# Patient Record
Sex: Male | Born: 1950 | Race: White | Hispanic: No | Marital: Married | Smoking: Never smoker
Health system: Southern US, Community
[De-identification: ages and names within clinical notes are randomized; demographics above are authoritative.]

## PROBLEM LIST (undated history)

## (undated) DIAGNOSIS — N189 Chronic kidney disease, unspecified: Secondary | ICD-10-CM

## (undated) DIAGNOSIS — I10 Essential (primary) hypertension: Secondary | ICD-10-CM

## (undated) DIAGNOSIS — I639 Cerebral infarction, unspecified: Secondary | ICD-10-CM

## (undated) DIAGNOSIS — E119 Type 2 diabetes mellitus without complications: Secondary | ICD-10-CM

## (undated) DIAGNOSIS — Z8673 Personal history of transient ischemic attack (TIA), and cerebral infarction without residual deficits: Secondary | ICD-10-CM

## (undated) HISTORY — PX: OTHER SURGICAL HISTORY: SHX169

---

## 2011-09-15 ENCOUNTER — Ambulatory Visit (INDEPENDENT_AMBULATORY_CARE_PROVIDER_SITE_OTHER): Payer: PRIVATE HEALTH INSURANCE | Admitting: Ophthalmology

## 2011-09-15 DIAGNOSIS — E11319 Type 2 diabetes mellitus with unspecified diabetic retinopathy without macular edema: Secondary | ICD-10-CM

## 2011-09-15 DIAGNOSIS — H43819 Vitreous degeneration, unspecified eye: Secondary | ICD-10-CM

## 2011-09-15 DIAGNOSIS — E1139 Type 2 diabetes mellitus with other diabetic ophthalmic complication: Secondary | ICD-10-CM

## 2011-09-24 ENCOUNTER — Encounter (INDEPENDENT_AMBULATORY_CARE_PROVIDER_SITE_OTHER): Payer: PRIVATE HEALTH INSURANCE | Admitting: Ophthalmology

## 2011-09-24 DIAGNOSIS — H3581 Retinal edema: Secondary | ICD-10-CM

## 2011-10-22 ENCOUNTER — Encounter (INDEPENDENT_AMBULATORY_CARE_PROVIDER_SITE_OTHER): Payer: PRIVATE HEALTH INSURANCE | Admitting: Ophthalmology

## 2011-10-22 DIAGNOSIS — E11359 Type 2 diabetes mellitus with proliferative diabetic retinopathy without macular edema: Secondary | ICD-10-CM

## 2012-02-21 ENCOUNTER — Ambulatory Visit (INDEPENDENT_AMBULATORY_CARE_PROVIDER_SITE_OTHER): Payer: PRIVATE HEALTH INSURANCE | Admitting: Ophthalmology

## 2012-08-18 DIAGNOSIS — I639 Cerebral infarction, unspecified: Secondary | ICD-10-CM

## 2012-08-18 HISTORY — DX: Cerebral infarction, unspecified: I63.9

## 2013-04-04 ENCOUNTER — Encounter (INDEPENDENT_AMBULATORY_CARE_PROVIDER_SITE_OTHER): Payer: BC Managed Care – PPO | Admitting: Ophthalmology

## 2013-04-04 DIAGNOSIS — E1165 Type 2 diabetes mellitus with hyperglycemia: Secondary | ICD-10-CM

## 2013-04-04 DIAGNOSIS — I10 Essential (primary) hypertension: Secondary | ICD-10-CM

## 2013-04-04 DIAGNOSIS — E11359 Type 2 diabetes mellitus with proliferative diabetic retinopathy without macular edema: Secondary | ICD-10-CM

## 2013-04-04 DIAGNOSIS — E11319 Type 2 diabetes mellitus with unspecified diabetic retinopathy without macular edema: Secondary | ICD-10-CM

## 2013-04-04 DIAGNOSIS — H43819 Vitreous degeneration, unspecified eye: Secondary | ICD-10-CM

## 2013-04-04 DIAGNOSIS — H35039 Hypertensive retinopathy, unspecified eye: Secondary | ICD-10-CM

## 2013-04-04 DIAGNOSIS — H4310 Vitreous hemorrhage, unspecified eye: Secondary | ICD-10-CM

## 2013-04-05 ENCOUNTER — Encounter (HOSPITAL_COMMUNITY): Payer: Self-pay | Admitting: Respiratory Therapy

## 2013-04-05 DIAGNOSIS — E113593 Type 2 diabetes mellitus with proliferative diabetic retinopathy without macular edema, bilateral: Secondary | ICD-10-CM | POA: Diagnosis present

## 2013-04-05 DIAGNOSIS — H4310 Vitreous hemorrhage, unspecified eye: Secondary | ICD-10-CM | POA: Diagnosis present

## 2013-04-05 NOTE — H&P (Signed)
Wayne Dodson is an 62 y.o. male.   Chief Complaint:loss of vision right eye HPI: longstanding diabetes with new vitreous hemorrhage right eye  No past medical history on file.  No past surgical history on file.  No family history on file. Social History:  has no tobacco, alcohol, and drug history on file.  Allergies: Allergies not on file  No prescriptions prior to admission    Review of systems otherwise negative  There were no vitals taken for this visit.  Physical exam: Mental status: oriented x3. Eyes: See eye exam associated with this date of surgery in media tab.  Scanned in by scanning center Ears, Nose, Throat: within normal limits Neck: Within Normal limits General: within normal limits Chest: Within normal limits Breast: deferred Heart: Within normal limits Abdomen: Within normal limits GU: deferred Extremities: within normal limits Skin: within normal limits  Assessment/Plan New vitreous hemorrhage, proliferative diabetic retinopathy right eye Plan: To [facility] for  Pars plana vitrectomy , laser therapy and gas injection.right eye  Sherrie George 04/05/2013, 7:49 AM

## 2013-04-06 ENCOUNTER — Encounter (HOSPITAL_COMMUNITY): Payer: Self-pay | Admitting: *Deleted

## 2013-04-06 NOTE — Progress Notes (Signed)
This patient back in October of 2013 had "a mini stroke" , was taken to [facility].  He stayed overnight and thinks he had ultrasound of heart, ekg, dopplers of carotids...he has no deficits, and hasn't had a problem since.Marland KitchenMarland KitchenMarland KitchenWill need those records.........  He was told to come @ 9:00...i told him to come @ 8:45.Marland Kitchen.he will be taking most all his meds per the surgeon, except the Amaryl.............DA

## 2013-04-09 MED ORDER — CEFAZOLIN SODIUM-DEXTROSE 2-3 GM-% IV SOLR
2.0000 g | INTRAVENOUS | Status: AC
  Administered 2013-04-10: 2 g via INTRAVENOUS
  Filled 2013-04-09: qty 50

## 2013-04-09 MED ORDER — GATIFLOXACIN 0.5 % OP SOLN
1.0000 [drp] | OPHTHALMIC | Status: AC | PRN
  Administered 2013-04-10: 1 [drp] via OPHTHALMIC
  Filled 2013-04-09: qty 2.5

## 2013-04-09 MED ORDER — TROPICAMIDE 1 % OP SOLN
1.0000 [drp] | OPHTHALMIC | Status: AC | PRN
  Administered 2013-04-10: 1 [drp] via OPHTHALMIC
  Filled 2013-04-09: qty 3

## 2013-04-09 MED ORDER — CYCLOPENTOLATE HCL 1 % OP SOLN
1.0000 [drp] | OPHTHALMIC | Status: AC | PRN
  Administered 2013-04-10: 1 [drp] via OPHTHALMIC
  Filled 2013-04-09: qty 2

## 2013-04-09 MED ORDER — PHENYLEPHRINE HCL 2.5 % OP SOLN: 1.0000 [drp] | OPHTHALMIC | Status: AC | PRN

## 2013-04-10 ENCOUNTER — Encounter (HOSPITAL_COMMUNITY)
Admission: RE | Disposition: A | Discharge status: Home / Self Care | Payer: Self-pay | Source: Ambulatory Visit | Attending: Ophthalmology

## 2013-04-10 ENCOUNTER — Ambulatory Visit (HOSPITAL_COMMUNITY)
Admission: RE | Admit: 2013-04-10 | Discharge: 2013-04-11 | Disposition: A | Discharge status: Home / Self Care | Payer: BC Managed Care – PPO | Source: Ambulatory Visit | Attending: Ophthalmology | Admitting: Ophthalmology

## 2013-04-10 ENCOUNTER — Ambulatory Visit (HOSPITAL_COMMUNITY): Payer: BC Managed Care – PPO

## 2013-04-10 ENCOUNTER — Encounter (HOSPITAL_COMMUNITY): Payer: Self-pay | Admitting: Certified Registered"

## 2013-04-10 ENCOUNTER — Ambulatory Visit (HOSPITAL_COMMUNITY): Payer: BC Managed Care – PPO | Admitting: Certified Registered"

## 2013-04-10 DIAGNOSIS — E1165 Type 2 diabetes mellitus with hyperglycemia: Secondary | ICD-10-CM

## 2013-04-10 DIAGNOSIS — H5450 Low vision, one eye, unspecified eye: Secondary | ICD-10-CM | POA: Insufficient documentation

## 2013-04-10 DIAGNOSIS — E11359 Type 2 diabetes mellitus with proliferative diabetic retinopathy without macular edema: Secondary | ICD-10-CM

## 2013-04-10 DIAGNOSIS — H4311 Vitreous hemorrhage, right eye: Secondary | ICD-10-CM

## 2013-04-10 DIAGNOSIS — E1139 Type 2 diabetes mellitus with other diabetic ophthalmic complication: Secondary | ICD-10-CM

## 2013-04-10 DIAGNOSIS — E113593 Type 2 diabetes mellitus with proliferative diabetic retinopathy without macular edema, bilateral: Secondary | ICD-10-CM | POA: Diagnosis present

## 2013-04-10 DIAGNOSIS — H4310 Vitreous hemorrhage, unspecified eye: Secondary | ICD-10-CM | POA: Diagnosis present

## 2013-04-10 HISTORY — PX: PARS PLANA VITRECTOMY: SHX2166

## 2013-04-10 HISTORY — DX: Type 2 diabetes mellitus without complications: E11.9

## 2013-04-10 HISTORY — PX: PHOTOCOAGULATION WITH LASER: SHX6027

## 2013-04-10 HISTORY — DX: Cerebral infarction, unspecified: I63.9

## 2013-04-10 HISTORY — DX: Chronic kidney disease, unspecified: N18.9

## 2013-04-10 HISTORY — DX: Personal history of transient ischemic attack (TIA), and cerebral infarction without residual deficits: Z86.73

## 2013-04-10 HISTORY — DX: Essential (primary) hypertension: I10

## 2013-04-10 LAB — CBC
MCH: 28.6 pg (ref 26.0–34.0)
Platelets: 157 10*3/uL (ref 150–400)
RBC: 4.34 MIL/uL (ref 4.22–5.81)
WBC: 7.5 10*3/uL (ref 4.0–10.5)

## 2013-04-10 LAB — BASIC METABOLIC PANEL
Calcium: 8.8 mg/dL (ref 8.4–10.5)
GFR calc non Af Amer: 30 mL/min — ABNORMAL LOW (ref >90)
Sodium: 144 mEq/L (ref 135–145)

## 2013-04-10 LAB — GLUCOSE, CAPILLARY
Glucose-Capillary: 118 mg/dL — ABNORMAL HIGH (ref 70–99)
Glucose-Capillary: 90 mg/dL (ref 70–99)
Glucose-Capillary: 105 mg/dL — ABNORMAL HIGH (ref 70–99)
Glucose-Capillary: 220 mg/dL — ABNORMAL HIGH (ref 70–99)

## 2013-04-10 SURGERY — PARS PLANA VITRECTOMY WITH 25 GAUGE
Anesthesia: General | Site: Eye | Laterality: Right | Wound class: Clean

## 2013-04-10 MED ORDER — BRIMONIDINE TARTRATE 0.2 % OP SOLN
1.0000 [drp] | Freq: Two times a day (BID) | OPHTHALMIC | Status: DC
  Filled 2013-04-10 (×2): qty 5

## 2013-04-10 MED ORDER — GATIFLOXACIN 0.5 % OP SOLN
1.0000 [drp] | Freq: Four times a day (QID) | OPHTHALMIC | Status: DC
  Filled 2013-04-10: qty 2.5

## 2013-04-10 MED ORDER — ATROPINE SULFATE 1 % OP SOLN
OPHTHALMIC | Status: AC
  Filled 2013-04-10: qty 2

## 2013-04-10 MED ORDER — LIDOCAINE HCL 4 % MT SOLN
OROMUCOSAL | Status: DC | PRN
  Administered 2013-04-10: 4 mL via TOPICAL

## 2013-04-10 MED ORDER — SODIUM CHLORIDE 0.45 % IV SOLN
INTRAVENOUS | Status: DC
  Administered 2013-04-10: 20 mL/h via INTRAVENOUS

## 2013-04-10 MED ORDER — PROMETHAZINE HCL 25 MG/ML IJ SOLN: 6.2500 mg | INTRAMUSCULAR | Status: DC | PRN

## 2013-04-10 MED ORDER — SODIUM CHLORIDE 0.9 % IV SOLN
INTRAVENOUS | Status: DC | PRN
  Administered 2013-04-10: 12:00:00 via INTRAVENOUS

## 2013-04-10 MED ORDER — TEMAZEPAM 15 MG PO CAPS: 15.0000 mg | ORAL_CAPSULE | Freq: Every evening | ORAL | Status: DC | PRN

## 2013-04-10 MED ORDER — ONDANSETRON HCL 4 MG/2ML IJ SOLN: 4.0000 mg | Freq: Four times a day (QID) | INTRAMUSCULAR | Status: DC | PRN

## 2013-04-10 MED ORDER — ONDANSETRON HCL 4 MG/2ML IJ SOLN
INTRAMUSCULAR | Status: DC | PRN
  Administered 2013-04-10: 4 mg via INTRAVENOUS

## 2013-04-10 MED ORDER — ACETAMINOPHEN 325 MG PO TABS: 325.0000 mg | ORAL_TABLET | ORAL | Status: DC | PRN

## 2013-04-10 MED ORDER — GABAPENTIN 100 MG PO CAPS
100.0000 mg | ORAL_CAPSULE | Freq: Three times a day (TID) | ORAL | Status: DC
  Administered 2013-04-10 (×2): 100 mg via ORAL
  Filled 2013-04-10 (×5): qty 1

## 2013-04-10 MED ORDER — HYDRALAZINE HCL 50 MG PO TABS
50.0000 mg | ORAL_TABLET | Freq: Two times a day (BID) | ORAL | Status: DC
  Administered 2013-04-10: 50 mg via ORAL
  Filled 2013-04-10 (×3): qty 1

## 2013-04-10 MED ORDER — EPINEPHRINE HCL 1 MG/ML IJ SOLN
INTRAMUSCULAR | Status: AC
  Filled 2013-04-10: qty 1

## 2013-04-10 MED ORDER — HYDROCODONE-ACETAMINOPHEN 5-325 MG PO TABS
1.0000 | ORAL_TABLET | ORAL | Status: DC | PRN
  Administered 2013-04-10 (×2): 2 via ORAL
  Filled 2013-04-10: qty 2

## 2013-04-10 MED ORDER — 0.9 % SODIUM CHLORIDE (POUR BTL) OPTIME
TOPICAL | Status: DC | PRN
  Administered 2013-04-10: 1000 mL

## 2013-04-10 MED ORDER — GLYCOPYRROLATE 0.2 MG/ML IJ SOLN
INTRAMUSCULAR | Status: DC | PRN
  Administered 2013-04-10: 0.4 mg via INTRAVENOUS
  Administered 2013-04-10: 0.2 mg via INTRAVENOUS

## 2013-04-10 MED ORDER — MORPHINE SULFATE 2 MG/ML IJ SOLN
1.0000 mg | INTRAMUSCULAR | Status: DC | PRN
  Administered 2013-04-10: 2 mg via INTRAVENOUS

## 2013-04-10 MED ORDER — OXYCODONE HCL 5 MG PO TABS: 5.0000 mg | ORAL_TABLET | Freq: Once | ORAL | Status: DC | PRN

## 2013-04-10 MED ORDER — DOCUSATE SODIUM 100 MG PO CAPS
100.0000 mg | ORAL_CAPSULE | Freq: Two times a day (BID) | ORAL | Status: DC
  Administered 2013-04-10: 100 mg via ORAL
  Filled 2013-04-10 (×2): qty 1

## 2013-04-10 MED ORDER — GLIMEPIRIDE 4 MG PO TABS
4.0000 mg | ORAL_TABLET | Freq: Every day | ORAL | Status: DC
  Administered 2013-04-11: 4 mg via ORAL
  Filled 2013-04-10 (×2): qty 1

## 2013-04-10 MED ORDER — FUROSEMIDE 40 MG PO TABS
40.0000 mg | ORAL_TABLET | Freq: Every day | ORAL | Status: DC
  Filled 2013-04-10: qty 1

## 2013-04-10 MED ORDER — HYDROCHLOROTHIAZIDE 25 MG PO TABS
25.0000 mg | ORAL_TABLET | Freq: Every day | ORAL | Status: DC
  Filled 2013-04-10: qty 1

## 2013-04-10 MED ORDER — HEMOSTATIC AGENTS (NO CHARGE) OPTIME
TOPICAL | Status: DC | PRN
  Administered 2013-04-10: 1 via TOPICAL

## 2013-04-10 MED ORDER — PHENYLEPHRINE HCL 2.5 % OP SOLN
OPHTHALMIC | Status: AC
  Administered 2013-04-10: 1 [drp] via OPHTHALMIC
  Filled 2013-04-10: qty 2

## 2013-04-10 MED ORDER — PHENYLEPHRINE HCL 10 MG/ML IJ SOLN
INTRAMUSCULAR | Status: DC | PRN
  Administered 2013-04-10: 40 ug via INTRAVENOUS

## 2013-04-10 MED ORDER — INSULIN ASPART 100 UNIT/ML ~~LOC~~ SOLN
0.0000 [IU] | SUBCUTANEOUS | Status: DC
Start: 2013-04-10 — End: 2013-04-11
  Administered 2013-04-10: 3 [IU] via SUBCUTANEOUS
  Administered 2013-04-10: 5 [IU] via SUBCUTANEOUS
  Administered 2013-04-11: 3 [IU] via SUBCUTANEOUS
  Administered 2013-04-11: 2 [IU] via SUBCUTANEOUS

## 2013-04-10 MED ORDER — WHITE PETROLATUM GEL
Status: AC
  Administered 2013-04-10: 0.2
  Filled 2013-04-10: qty 5

## 2013-04-10 MED ORDER — MIDAZOLAM HCL 5 MG/5ML IJ SOLN
INTRAMUSCULAR | Status: DC | PRN
  Administered 2013-04-10: 2 mg via INTRAVENOUS

## 2013-04-10 MED ORDER — ROCURONIUM BROMIDE 100 MG/10ML IV SOLN
INTRAVENOUS | Status: DC | PRN
  Administered 2013-04-10: 50 mg via INTRAVENOUS

## 2013-04-10 MED ORDER — ARTIFICIAL TEARS OP OINT
TOPICAL_OINTMENT | OPHTHALMIC | Status: DC | PRN
  Administered 2013-04-10: 1 via OPHTHALMIC

## 2013-04-10 MED ORDER — BUPIVACAINE HCL (PF) 0.75 % IJ SOLN
INTRAMUSCULAR | Status: DC | PRN
  Administered 2013-04-10: 10 mL

## 2013-04-10 MED ORDER — FENTANYL CITRATE 0.05 MG/ML IJ SOLN
INTRAMUSCULAR | Status: DC | PRN
  Administered 2013-04-10: 100 ug via INTRAVENOUS

## 2013-04-10 MED ORDER — BACITRACIN-POLYMYXIN B 500-10000 UNIT/GM OP OINT
TOPICAL_OINTMENT | OPHTHALMIC | Status: AC
  Filled 2013-04-10: qty 3.5

## 2013-04-10 MED ORDER — BACITRACIN-POLYMYXIN B 500-10000 UNIT/GM OP OINT
1.0000 "application " | TOPICAL_OINTMENT | Freq: Four times a day (QID) | OPHTHALMIC | Status: DC
  Filled 2013-04-10: qty 3.5

## 2013-04-10 MED ORDER — GENTAMICIN SULFATE 40 MG/ML IJ SOLN
INTRAMUSCULAR | Status: AC
  Filled 2013-04-10: qty 2

## 2013-04-10 MED ORDER — BUPIVACAINE HCL (PF) 0.75 % IJ SOLN
INTRAMUSCULAR | Status: AC
  Filled 2013-04-10: qty 10

## 2013-04-10 MED ORDER — EPINEPHRINE HCL 1 MG/ML IJ SOLN
INTRAOCULAR | Status: DC | PRN
  Administered 2013-04-10: 12:00:00

## 2013-04-10 MED ORDER — MORPHINE SULFATE 2 MG/ML IJ SOLN: 1.0000 mg | INTRAMUSCULAR | Status: DC | PRN

## 2013-04-10 MED ORDER — MAGNESIUM HYDROXIDE 400 MG/5ML PO SUSP: 15.0000 mL | Freq: Four times a day (QID) | ORAL | Status: DC | PRN

## 2013-04-10 MED ORDER — SIMVASTATIN 10 MG PO TABS
10.0000 mg | ORAL_TABLET | Freq: Every day | ORAL | Status: DC
  Administered 2013-04-10: 10 mg via ORAL
  Filled 2013-04-10 (×2): qty 1

## 2013-04-10 MED ORDER — TETRACAINE HCL 0.5 % OP SOLN
2.0000 [drp] | Freq: Once | OPHTHALMIC | Status: DC
  Filled 2013-04-10: qty 2

## 2013-04-10 MED ORDER — BACITRACIN-POLYMYXIN B 500-10000 UNIT/GM OP OINT
TOPICAL_OINTMENT | OPHTHALMIC | Status: DC | PRN
  Administered 2013-04-10: 1 via OPHTHALMIC

## 2013-04-10 MED ORDER — SODIUM HYALURONATE 10 MG/ML IO SOLN
INTRAOCULAR | Status: AC
  Filled 2013-04-10: qty 0.85

## 2013-04-10 MED ORDER — SODIUM CHLORIDE 0.9 % IJ SOLN
INTRAMUSCULAR | Status: DC | PRN
  Administered 2013-04-10: 12:00:00

## 2013-04-10 MED ORDER — OXYCODONE HCL 5 MG/5ML PO SOLN: 5.0000 mg | Freq: Once | ORAL | Status: DC | PRN

## 2013-04-10 MED ORDER — NEOSTIGMINE METHYLSULFATE 1 MG/ML IJ SOLN
INTRAMUSCULAR | Status: DC | PRN
  Administered 2013-04-10: 3 mg via INTRAVENOUS

## 2013-04-10 MED ORDER — DEXAMETHASONE SODIUM PHOSPHATE 10 MG/ML IJ SOLN
INTRAMUSCULAR | Status: AC
  Filled 2013-04-10: qty 1

## 2013-04-10 MED ORDER — LATANOPROST 0.005 % OP SOLN
1.0000 [drp] | Freq: Every day | OPHTHALMIC | Status: DC
  Filled 2013-04-10: qty 2.5

## 2013-04-10 MED ORDER — NIFEDIPINE ER OSMOTIC RELEASE 90 MG PO TB24
90.0000 mg | ORAL_TABLET | Freq: Every day | ORAL | Status: DC
  Administered 2013-04-10: 90 mg via ORAL
  Filled 2013-04-10 (×2): qty 1

## 2013-04-10 MED ORDER — DEXAMETHASONE SODIUM PHOSPHATE 10 MG/ML IJ SOLN
INTRAMUSCULAR | Status: DC | PRN
  Administered 2013-04-10: 10 mg

## 2013-04-10 MED ORDER — PROPOFOL 10 MG/ML IV BOLUS
INTRAVENOUS | Status: DC | PRN
  Administered 2013-04-10: 160 mg via INTRAVENOUS

## 2013-04-10 MED ORDER — HYALURONIDASE HUMAN 150 UNIT/ML IJ SOLN
INTRAMUSCULAR | Status: AC
  Filled 2013-04-10: qty 1

## 2013-04-10 MED ORDER — ACETAZOLAMIDE SODIUM 500 MG IJ SOLR
500.0000 mg | Freq: Once | INTRAMUSCULAR | Status: AC
Start: 2013-04-11 — End: 2013-04-11
  Administered 2013-04-11: 500 mg via INTRAVENOUS
  Filled 2013-04-10: qty 500

## 2013-04-10 MED ORDER — NEBIVOLOL HCL 10 MG PO TABS
10.0000 mg | ORAL_TABLET | Freq: Every day | ORAL | Status: DC
  Administered 2013-04-10: 10 mg via ORAL
  Filled 2013-04-10 (×2): qty 1

## 2013-04-10 MED ORDER — MUPIROCIN 2 % EX OINT
TOPICAL_OINTMENT | CUTANEOUS | Status: AC
  Administered 2013-04-10: 1
  Filled 2013-04-10: qty 22

## 2013-04-10 MED ORDER — MORPHINE SULFATE 4 MG/ML IJ SOLN
INTRAMUSCULAR | Status: AC
  Filled 2013-04-10: qty 1

## 2013-04-10 MED ORDER — POLYMYXIN B SULFATE 500000 UNITS IJ SOLR
INTRAMUSCULAR | Status: AC
  Filled 2013-04-10: qty 1

## 2013-04-10 MED ORDER — SODIUM HYALURONATE 10 MG/ML IO SOLN
INTRAOCULAR | Status: DC | PRN
  Administered 2013-04-10: 0.85 mL via INTRAOCULAR

## 2013-04-10 MED ORDER — PREDNISOLONE ACETATE 1 % OP SUSP
1.0000 [drp] | Freq: Four times a day (QID) | OPHTHALMIC | Status: DC
  Filled 2013-04-10: qty 5
  Filled 2013-04-10: qty 1

## 2013-04-10 MED ORDER — LIDOCAINE HCL (CARDIAC) 20 MG/ML IV SOLN
INTRAVENOUS | Status: DC | PRN
  Administered 2013-04-10: 5 mg via INTRAVENOUS

## 2013-04-10 MED ORDER — HYDROCODONE-ACETAMINOPHEN 5-325 MG PO TABS
ORAL_TABLET | ORAL | Status: AC
  Filled 2013-04-10: qty 2

## 2013-04-10 SURGICAL SUPPLY — 48 items
CANNULA VLV SOFT TIP 25GA (OPHTHALMIC) ×2 IMPLANT
CLOTH BEACON ORANGE TIMEOUT ST (SAFETY) ×2 IMPLANT
CONSTELLATION VIT 25 + ×2 IMPLANT
COTTONBALL LRG STERILE PKG (GAUZE/BANDAGES/DRESSINGS) ×6 IMPLANT
DRAPE INCISE 51X51 W/FILM STRL (DRAPES) ×2 IMPLANT
DRAPE OPHTHALMIC 77X100 STRL (CUSTOM PROCEDURE TRAY) ×2 IMPLANT
FORCEPS ECKARDT ILM 25G SERR (OPHTHALMIC RELATED) IMPLANT
GLOVE SS BIOGEL STRL SZ 6.5 (GLOVE) ×1 IMPLANT
GLOVE SS BIOGEL STRL SZ 7 (GLOVE) ×1 IMPLANT
GLOVE SUPERSENSE BIOGEL SZ 6.5 (GLOVE) ×1
GLOVE SUPERSENSE BIOGEL SZ 7 (GLOVE) ×1
GLOVE SURG 8.5 LATEX PF (GLOVE) ×2 IMPLANT
GLOVE SURG SS PI 6.0 STRL IVOR (GLOVE) ×4 IMPLANT
GOWN STRL NON-REIN LRG LVL3 (GOWN DISPOSABLE) ×8 IMPLANT
ILLUMINATOR CHOW PICK 25GA (MISCELLANEOUS) ×2 IMPLANT
KIT BASIN OR (CUSTOM PROCEDURE TRAY) ×2 IMPLANT
LENS BIOM SUPER VIEW SET DISP (OPHTHALMIC RELATED) IMPLANT
MARKER SKIN DUAL TIP RULER LAB (MISCELLANEOUS) IMPLANT
MASK EYE SHIELD (GAUZE/BANDAGES/DRESSINGS) ×2 IMPLANT
MICROPICK 25G (MISCELLANEOUS)
NEEDLE 18GX1X1/2 (RX/OR ONLY) (NEEDLE) ×2 IMPLANT
NEEDLE 25GX 5/8IN NON SAFETY (NEEDLE) ×2 IMPLANT
NEEDLE FILTER BLUNT 18X 1/2SAF (NEEDLE) ×1
NEEDLE FILTER BLUNT 18X1 1/2 (NEEDLE) ×1 IMPLANT
NEEDLE HYPO 30X.5 LL (NEEDLE) ×2 IMPLANT
NS IRRIG 1000ML POUR BTL (IV SOLUTION) ×2 IMPLANT
PACK VITRECTOMY CUSTOM (CUSTOM PROCEDURE TRAY) ×2 IMPLANT
PAD ARMBOARD 7.5X6 YLW CONV (MISCELLANEOUS) ×4 IMPLANT
PAD EYE OVAL STERILE LF (GAUZE/BANDAGES/DRESSINGS) ×2 IMPLANT
PENCIL BIPOLAR 25GA STR DISP (OPHTHALMIC RELATED) IMPLANT
PICK MICROPICK 25G (MISCELLANEOUS) IMPLANT
PROBE DIRECTIONAL LASER (MISCELLANEOUS) ×2 IMPLANT
PROBE LASER ILLUM FLEX CVD 25G (OPHTHALMIC) ×2 IMPLANT
RESERVOIR BACK FLUSH (MISCELLANEOUS) IMPLANT
ROLLS DENTAL (MISCELLANEOUS) ×4 IMPLANT
SCRAPER DIAMOND DUST MEMBRANE (MISCELLANEOUS) IMPLANT
SPONGE SURGIFOAM ABS GEL 12-7 (HEMOSTASIS) ×2 IMPLANT
STOPCOCK 4 WAY LG BORE MALE ST (IV SETS) IMPLANT
SUT CHROMIC 7 0 TG140 8 (SUTURE) IMPLANT
SYR 20CC LL (SYRINGE) ×2 IMPLANT
SYR BULB 3OZ (MISCELLANEOUS) ×2 IMPLANT
SYR TB 1ML LUER SLIP (SYRINGE) ×2 IMPLANT
TAPE SURG TRANSPORE 1 IN (GAUZE/BANDAGES/DRESSINGS) ×1 IMPLANT
TAPE SURGICAL TRANSPORE 1 IN (GAUZE/BANDAGES/DRESSINGS) ×1
TOWEL OR 17X24 6PK STRL BLUE (TOWEL DISPOSABLE) ×6 IMPLANT
TROCAR CANNULA 25GA (CANNULA) IMPLANT
WATER STERILE IRR 1000ML POUR (IV SOLUTION) ×2 IMPLANT
WIPE INSTRUMENT VISIWIPE 73X73 (MISCELLANEOUS) ×2 IMPLANT

## 2013-04-10 NOTE — Progress Notes (Signed)
Report given to mark rn as caregiver 

## 2013-04-10 NOTE — Preoperative (Signed)
Beta Blockers   Reason not to administer Beta Blockers:Nebivolol taken by patient at 2000 hrs on 03/2313

## 2013-04-10 NOTE — Progress Notes (Signed)
Call to [facility]osp, spoke with Earley Abide in Health information dept., request ekg , echocardiogram be faxed to [facility], asap. Faxed cover sheet /w [facility] letterhead.

## 2013-04-10 NOTE — Anesthesia Procedure Notes (Signed)
Procedure Name: Intubation Date/Time: 04/10/2013 12:23 PM Performed by: Charm Barges, Zakira Ressel R Pre-anesthesia Checklist: Patient identified, Emergency Drugs available, Suction available, Patient being monitored and Timeout performed Patient Re-evaluated:Patient Re-evaluated prior to inductionOxygen Delivery Method: Circle system utilized Preoxygenation: Pre-oxygenation with 100% oxygen Intubation Type: IV induction Ventilation: Mask ventilation without difficulty Laryngoscope Size: Mac and 4 Grade View: Grade II Tube type: Oral Tube size: 7.5 mm Number of attempts: 1 Airway Equipment and Method: Stylet Placement Confirmation: ETT inserted through vocal cords under direct vision,  positive ETCO2 and breath sounds checked- equal and bilateral Secured at: 21 cm Tube secured with: Tape Dental Injury: Teeth and Oropharynx as per pre-operative assessment

## 2013-04-10 NOTE — Op Note (Signed)
NAME:  Wayne Dodson, Wayne Dodson NO.:  [id number]  MEDICAL RECORD NO.:  [id number]  LOCATION:  [facility]                         FACILITY:  [facility]  PHYSICIAN:  Beulah Gandy. Ashley Royalty, M.D. DATE OF BIRTH:  October 22, 1950  DATE OF PROCEDURE:  04/10/2013 DATE OF DISCHARGE:                              OPERATIVE REPORT   ADMISSION DIAGNOSES:  Vitreous hemorrhage, proliferative diabetic retinopathy, right eye.  PROCEDURES:  Pars plana vitrectomy, retinal photocoagulation, gas fluid exchange, right eye.  SURGEON:  Beulah Gandy. Ashley Royalty, M.D.  ASSISTANT:  Rosalie Doctor SA.  ANESTHESIA:  General.  DETAILS:  Usual prep and drape, 25-gauge trocars placed at 8, 10 and 2 o'clock, infusion at 8 o'clock.  The lighted pick and the cutter placed at 10 and 2 o'clock respectively.  Pars plana vitrectomy was begun just behind the crystalline lens.  The vitrectomy was carried down to the macular surface where blood was encountered.  This was carefully removed under low suction and rapid cutting.  There was some traction on the disk, these membranes were lifted with the lighted pick and the cutter. These were removed carefully.  The vitrectomy was carried out to the mid periphery where surface proliferation was removed from the retinal surface.  The vitrectomy was carried into the far periphery where blood was vacuumed and removed in large chunks from the far periphery. Scleral depression was used.  Once this was accomplished, the endolaser was positioned in the eye, 862 burns were placed around the retinal periphery.  The power was 440 mW, 1000 microns each and 0.1 seconds each.  A 30% gas fluid exchange was then carried out.  The instruments removed from the eye.  The 25-gauge trocars were removed.  The wounds were tested and found to be secure.  Polymyxin and gentamicin were irrigated into tenon space.  No atropine solution was used.  Marcaine was injected around the globe for postop pain.  Decadron 10 mg  was injected to the lower subconjunctival space.  The closing pressure was 10 with a Barraquer tonometer.  COMPLICATIONS:  None.  DURATION:  1 hour.  The patient was awakened, taken to recovery in a satisfactory condition.    Beulah Gandy. Ashley Royalty, M.D.    JDM/MEDQ  D:  04/10/2013  T:  04/10/2013  Job:  [id number]

## 2013-04-10 NOTE — Transfer of Care (Signed)
Immediate Anesthesia Transfer of Care Note  Patient: Wayne Dodson  Procedure(s) Performed: Procedure(s): PARS PLANA VITRECTOMY WITH 25 GAUGE (Right) ENDOLASER (Right)  Patient Location: PACU  Anesthesia Type:General  Level of Consciousness: awake  Airway & Oxygen Therapy: Patient Spontanous Breathing and Patient connected to nasal cannula oxygen  Post-op Assessment: Report given to PACU RN, Post -op Vital signs reviewed and stable and Patient moving all extremities  Post vital signs: Reviewed and stable  Complications: No apparent anesthesia complications

## 2013-04-10 NOTE — Brief Op Note (Signed)
Brief Operative note   Preoperative diagnosis:  Pre-Op Diagnosis Codes:    * Vitreous hemorrhage, right [379.23] Postoperative diagnosis  Post-Op Diagnosis Codes:    * Vitreous hemorrhage, right [379.23]  Procedures: Pars plana vitrectomy, laser, peel and gas injection right eye  Surgeon:  Sherrie George, MD...  Assistant:  Rosalie Doctor SA    Anesthesia: General  Specimen: none  Estimated blood loss:  1cc  Complications: none  Patient sent to PACU in good condition  Composed by Sherrie George MD  Dictation number: [phone]

## 2013-04-10 NOTE — H&P (Signed)
I examined the patient today and there is no change in the medical status 

## 2013-04-10 NOTE — Anesthesia Preprocedure Evaluation (Addendum)
Anesthesia Evaluation  Patient identified by MRN, date of birth, ID band Patient awake    Reviewed: Allergy & Precautions, H&P , NPO status , Patient's Chart, lab work & pertinent test results  Airway Mallampati: II TM Distance: >3 FB Neck ROM: Full    Dental  (+) Teeth Intact and Partial Upper   Pulmonary neg pulmonary ROS,  breath sounds clear to auscultation        Cardiovascular hypertension, Rhythm:Regular Rate:Normal + Systolic murmurs    Neuro/Psych negative neurological ROS     GI/Hepatic negative GI ROS, Neg liver ROS,   Endo/Other  diabetes  Renal/GU Renal InsufficiencyRenal diseasenegative Renal ROS  negative genitourinary   Musculoskeletal   Abdominal   Peds  Hematology   Anesthesia Other Findings   Reproductive/Obstetrics                        Anesthesia Physical Anesthesia Plan  ASA: III  Anesthesia Plan: General   Post-op Pain Management:    Induction: Intravenous  Airway Management Planned: Oral ETT  Additional Equipment:   Intra-op Plan:   Post-operative Plan: Extubation in OR  Informed Consent: I have reviewed the patients History and Physical, chart, labs and discussed the procedure including the risks, benefits and alternatives for the proposed anesthesia with the patient or authorized representative who has indicated his/her understanding and acceptance.   Dental advisory given  Plan Discussed with: CRNA, Surgeon and Anesthesiologist  Anesthesia Plan Comments:        Anesthesia Quick Evaluation

## 2013-04-11 MED ORDER — GATIFLOXACIN 0.5 % OP SOLN: 1.0000 [drp] | Freq: Four times a day (QID) | OPHTHALMIC | Status: AC

## 2013-04-11 MED ORDER — BACITRACIN-POLYMYXIN B 500-10000 UNIT/GM OP OINT: 1.0000 "application " | TOPICAL_OINTMENT | Freq: Four times a day (QID) | OPHTHALMIC | Status: AC

## 2013-04-11 MED ORDER — PREDNISOLONE ACETATE 1 % OP SUSP: 1.0000 [drp] | Freq: Four times a day (QID) | OPHTHALMIC | Status: AC

## 2013-04-11 NOTE — Discharge Summary (Signed)
Pt d/ced home with wife 

## 2013-04-11 NOTE — Progress Notes (Signed)
04/11/2013, 6:42 AM  Mental Status:  Awake, Alert, Oriented  Anterior segment: Cornea  Clear    Anterior Chamber Clear    Lens:   Cataract  Intra Ocular Pressure 20 mmHg with Tonopen  Vitreous: Clear 30%gas bubble   Retina:  Attached Good laser reaction   Impression: Excellent result Retina attached   Final Diagnosis: Principal Problem:   Proliferative diabetic retinopathy of both eyes Active Problems:   Vitreous hemorrhage   Plan: start post operative eye drops.  Discharge to home.  Give post operative instructions  Wayne Dodson 04/11/2013, 6:42 AM

## 2013-04-11 NOTE — Discharge Summary (Signed)
Discharge summary not needed on [facility] patients per medical records. 

## 2013-04-13 ENCOUNTER — Encounter (HOSPITAL_COMMUNITY): Payer: Self-pay | Admitting: Ophthalmology

## 2013-04-18 ENCOUNTER — Inpatient Hospital Stay (INDEPENDENT_AMBULATORY_CARE_PROVIDER_SITE_OTHER): Payer: BC Managed Care – PPO | Admitting: Ophthalmology

## 2013-04-18 DIAGNOSIS — E1139 Type 2 diabetes mellitus with other diabetic ophthalmic complication: Secondary | ICD-10-CM

## 2013-04-18 DIAGNOSIS — E11359 Type 2 diabetes mellitus with proliferative diabetic retinopathy without macular edema: Secondary | ICD-10-CM

## 2013-04-18 DIAGNOSIS — E1165 Type 2 diabetes mellitus with hyperglycemia: Secondary | ICD-10-CM

## 2013-04-30 NOTE — Anesthesia Postprocedure Evaluation (Signed)
  Anesthesia Post-op Note  Patient: Wayne Dodson  Procedure(s) Performed: Procedure(s): PARS PLANA VITRECTOMY WITH 25 GAUGE (Right) ENDOLASER (Right)  Patient Location: PACU  Anesthesia Type:General  Level of Consciousness: awake  Airway and Oxygen Therapy: Patient Spontanous Breathing  Post-op Pain: mild  Post-op Assessment: Post-op Vital signs reviewed  Post-op Vital Signs: stable  Complications: No apparent anesthesia complications

## 2013-05-10 ENCOUNTER — Encounter (INDEPENDENT_AMBULATORY_CARE_PROVIDER_SITE_OTHER): Payer: BC Managed Care – PPO | Admitting: Ophthalmology

## 2013-05-10 DIAGNOSIS — E1165 Type 2 diabetes mellitus with hyperglycemia: Secondary | ICD-10-CM

## 2013-05-10 DIAGNOSIS — E11359 Type 2 diabetes mellitus with proliferative diabetic retinopathy without macular edema: Secondary | ICD-10-CM

## 2013-05-10 DIAGNOSIS — E1139 Type 2 diabetes mellitus with other diabetic ophthalmic complication: Secondary | ICD-10-CM

## 2013-11-13 ENCOUNTER — Ambulatory Visit (INDEPENDENT_AMBULATORY_CARE_PROVIDER_SITE_OTHER): Payer: BC Managed Care – PPO | Admitting: Ophthalmology

## 2013-11-13 DIAGNOSIS — H35039 Hypertensive retinopathy, unspecified eye: Secondary | ICD-10-CM

## 2013-11-13 DIAGNOSIS — E1139 Type 2 diabetes mellitus with other diabetic ophthalmic complication: Secondary | ICD-10-CM

## 2013-11-13 DIAGNOSIS — I10 Essential (primary) hypertension: Secondary | ICD-10-CM

## 2013-11-13 DIAGNOSIS — H2510 Age-related nuclear cataract, unspecified eye: Secondary | ICD-10-CM

## 2013-11-13 DIAGNOSIS — E11319 Type 2 diabetes mellitus with unspecified diabetic retinopathy without macular edema: Secondary | ICD-10-CM

## 2013-11-13 DIAGNOSIS — E1165 Type 2 diabetes mellitus with hyperglycemia: Secondary | ICD-10-CM

## 2013-11-13 DIAGNOSIS — H43819 Vitreous degeneration, unspecified eye: Secondary | ICD-10-CM

## 2013-11-13 DIAGNOSIS — E11359 Type 2 diabetes mellitus with proliferative diabetic retinopathy without macular edema: Secondary | ICD-10-CM

## 2014-05-22 ENCOUNTER — Ambulatory Visit (INDEPENDENT_AMBULATORY_CARE_PROVIDER_SITE_OTHER): Payer: BC Managed Care – PPO | Admitting: Ophthalmology

## 2014-05-23 ENCOUNTER — Ambulatory Visit (INDEPENDENT_AMBULATORY_CARE_PROVIDER_SITE_OTHER): Payer: BC Managed Care – PPO | Admitting: Ophthalmology

## 2014-05-23 DIAGNOSIS — H2510 Age-related nuclear cataract, unspecified eye: Secondary | ICD-10-CM

## 2014-05-23 DIAGNOSIS — E11319 Type 2 diabetes mellitus with unspecified diabetic retinopathy without macular edema: Secondary | ICD-10-CM

## 2014-05-23 DIAGNOSIS — E11359 Type 2 diabetes mellitus with proliferative diabetic retinopathy without macular edema: Secondary | ICD-10-CM

## 2014-05-23 DIAGNOSIS — E1139 Type 2 diabetes mellitus with other diabetic ophthalmic complication: Secondary | ICD-10-CM

## 2014-05-23 DIAGNOSIS — H43819 Vitreous degeneration, unspecified eye: Secondary | ICD-10-CM

## 2014-05-23 DIAGNOSIS — E1165 Type 2 diabetes mellitus with hyperglycemia: Secondary | ICD-10-CM

## 2014-05-23 DIAGNOSIS — H35039 Hypertensive retinopathy, unspecified eye: Secondary | ICD-10-CM

## 2014-05-23 DIAGNOSIS — I10 Essential (primary) hypertension: Secondary | ICD-10-CM

## 2014-06-29 IMAGING — CR DG CHEST 2V
2 series · 2 of 2 positions shown · non-contrast
Comparison: None.

CLINICAL DATA: Preoperative respiratory evaluation prior to
ophthalmologic surgery.  Current history of diabetes and
hypertension.

CHEST - 2 VIEW

[w chest pa]
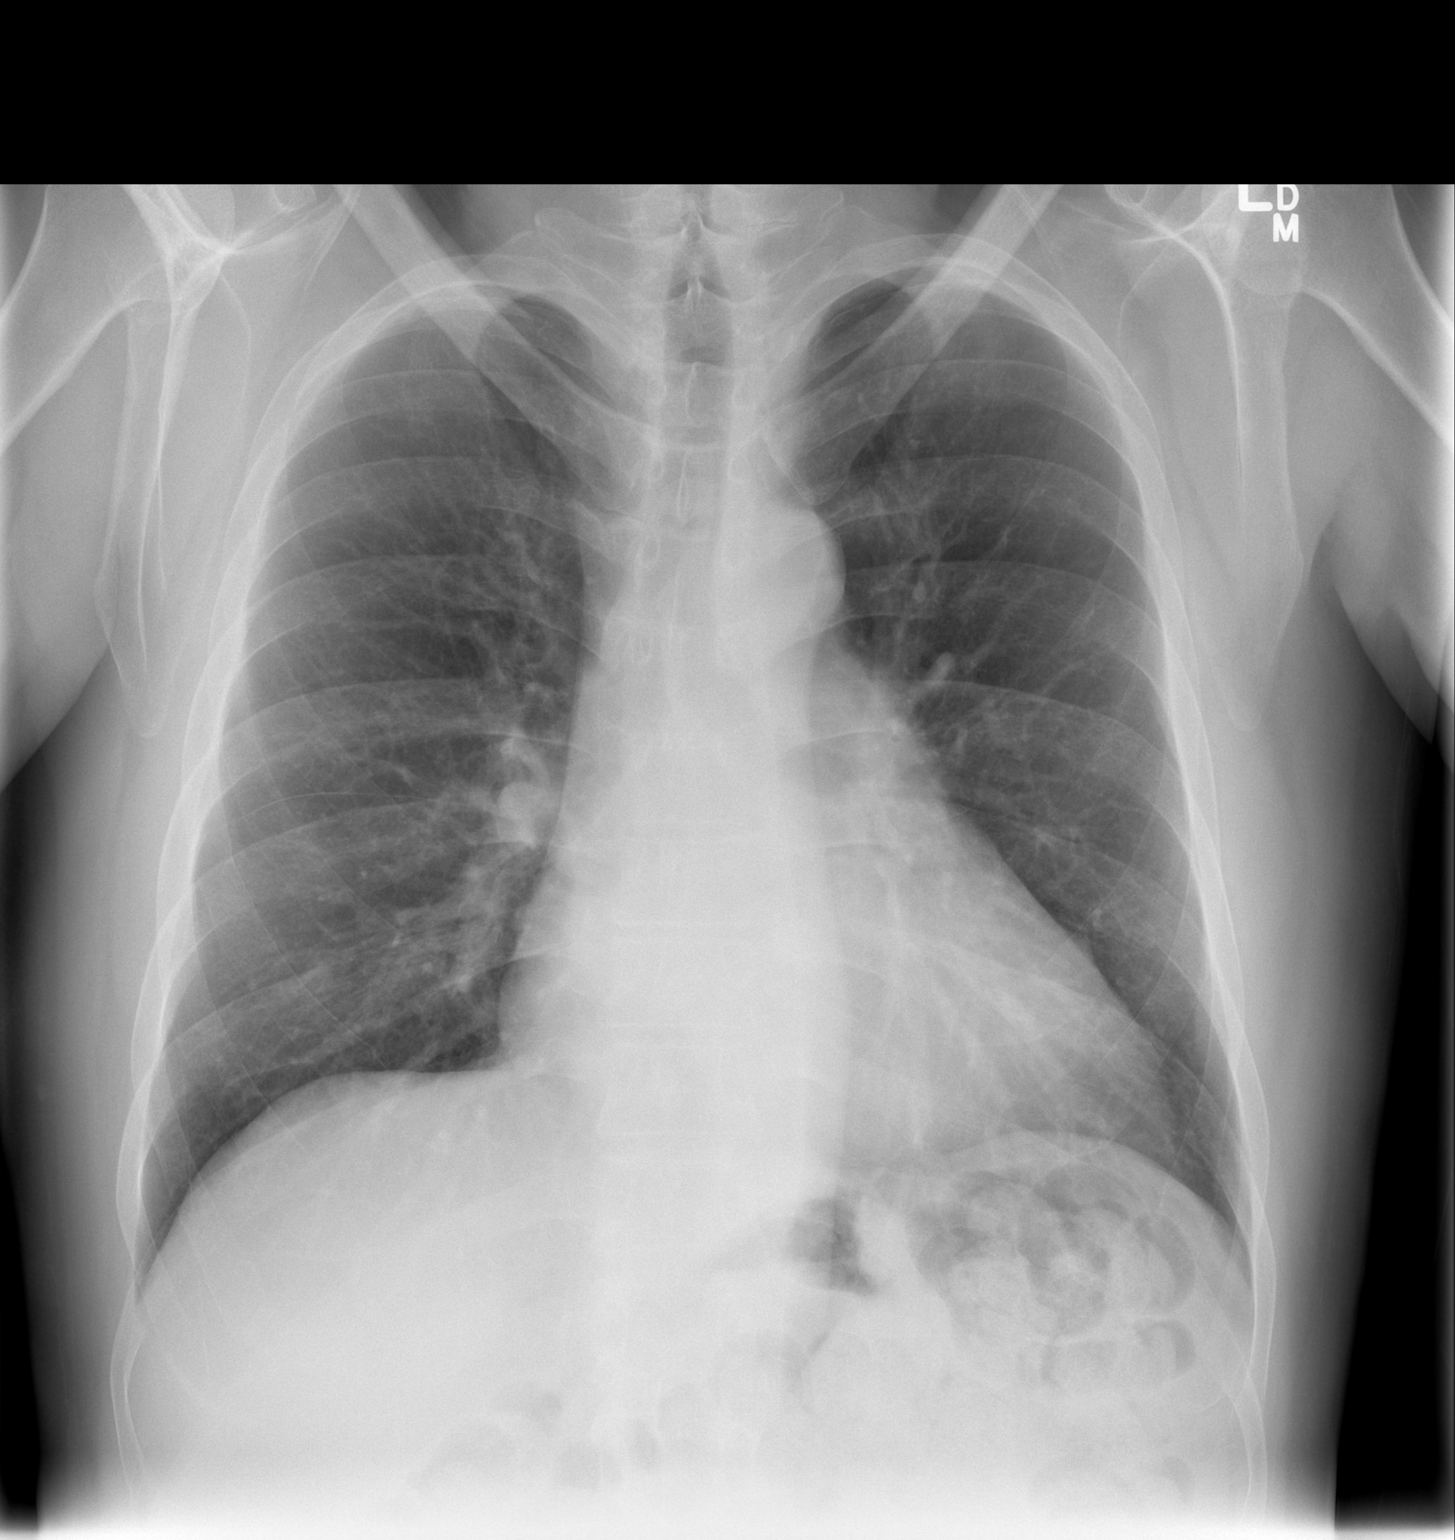

[w chest lat]
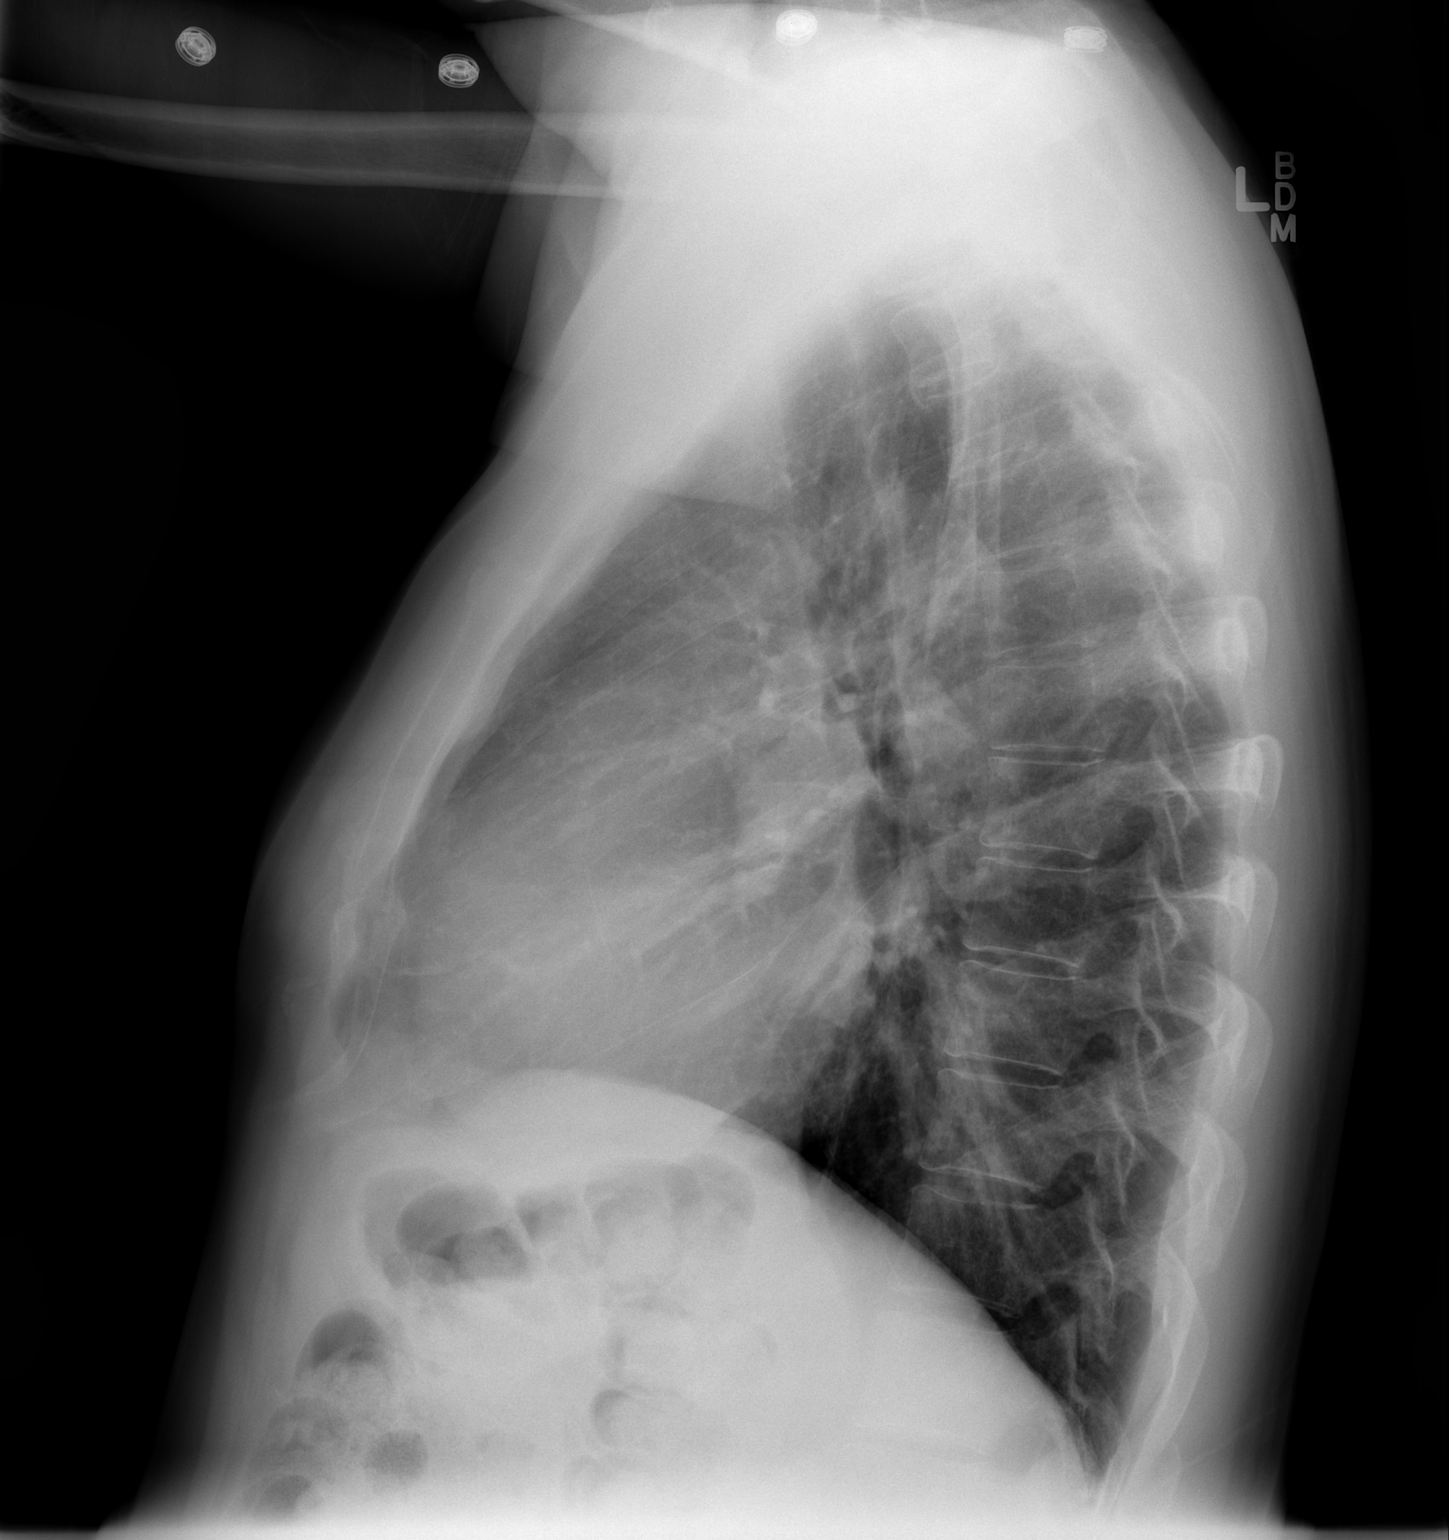

[2 of 2 positions shown; findings below may reference images not displayed]

FINDINGS: Cardiac silhouette mildly enlarged.  Hilar and
mediastinal contours otherwise unremarkable.  Lungs clear.
Bronchovascular markings normal.  Pulmonary vascularity normal.  No
pneumothorax.  No pleural effusions.  Visualized bony thorax
intact.
IMPRESSION: Mild cardiomegaly.  No acute cardiopulmonary disease.

## 2014-11-25 ENCOUNTER — Ambulatory Visit (INDEPENDENT_AMBULATORY_CARE_PROVIDER_SITE_OTHER): Payer: BLUE CROSS/BLUE SHIELD | Admitting: Ophthalmology

## 2014-11-25 DIAGNOSIS — I10 Essential (primary) hypertension: Secondary | ICD-10-CM

## 2014-11-25 DIAGNOSIS — H43813 Vitreous degeneration, bilateral: Secondary | ICD-10-CM

## 2014-11-25 DIAGNOSIS — E11311 Type 2 diabetes mellitus with unspecified diabetic retinopathy with macular edema: Secondary | ICD-10-CM

## 2014-11-25 DIAGNOSIS — E11351 Type 2 diabetes mellitus with proliferative diabetic retinopathy with macular edema: Secondary | ICD-10-CM

## 2014-11-25 DIAGNOSIS — E11331 Type 2 diabetes mellitus with moderate nonproliferative diabetic retinopathy with macular edema: Secondary | ICD-10-CM

## 2014-11-25 DIAGNOSIS — H35033 Hypertensive retinopathy, bilateral: Secondary | ICD-10-CM

## 2015-05-26 ENCOUNTER — Ambulatory Visit (INDEPENDENT_AMBULATORY_CARE_PROVIDER_SITE_OTHER): Payer: BLUE CROSS/BLUE SHIELD | Admitting: Ophthalmology

## 2015-06-05 ENCOUNTER — Ambulatory Visit (INDEPENDENT_AMBULATORY_CARE_PROVIDER_SITE_OTHER): Payer: BLUE CROSS/BLUE SHIELD | Admitting: Ophthalmology

## 2015-06-05 DIAGNOSIS — H35033 Hypertensive retinopathy, bilateral: Secondary | ICD-10-CM | POA: Diagnosis not present

## 2015-06-05 DIAGNOSIS — E11319 Type 2 diabetes mellitus with unspecified diabetic retinopathy without macular edema: Secondary | ICD-10-CM

## 2015-06-05 DIAGNOSIS — H43813 Vitreous degeneration, bilateral: Secondary | ICD-10-CM | POA: Diagnosis not present

## 2015-06-05 DIAGNOSIS — E11339 Type 2 diabetes mellitus with moderate nonproliferative diabetic retinopathy without macular edema: Secondary | ICD-10-CM

## 2015-06-05 DIAGNOSIS — I10 Essential (primary) hypertension: Secondary | ICD-10-CM | POA: Diagnosis not present

## 2015-06-05 DIAGNOSIS — E11359 Type 2 diabetes mellitus with proliferative diabetic retinopathy without macular edema: Secondary | ICD-10-CM

## 2015-12-10 ENCOUNTER — Ambulatory Visit (INDEPENDENT_AMBULATORY_CARE_PROVIDER_SITE_OTHER): Payer: BLUE CROSS/BLUE SHIELD | Admitting: Ophthalmology

## 2016-01-02 ENCOUNTER — Ambulatory Visit (INDEPENDENT_AMBULATORY_CARE_PROVIDER_SITE_OTHER): Payer: BLUE CROSS/BLUE SHIELD | Admitting: Ophthalmology

## 2016-01-02 DIAGNOSIS — I10 Essential (primary) hypertension: Secondary | ICD-10-CM | POA: Diagnosis not present

## 2016-01-02 DIAGNOSIS — E11319 Type 2 diabetes mellitus with unspecified diabetic retinopathy without macular edema: Secondary | ICD-10-CM | POA: Diagnosis not present

## 2016-01-02 DIAGNOSIS — E113591 Type 2 diabetes mellitus with proliferative diabetic retinopathy without macular edema, right eye: Secondary | ICD-10-CM | POA: Diagnosis not present

## 2016-01-02 DIAGNOSIS — E113392 Type 2 diabetes mellitus with moderate nonproliferative diabetic retinopathy without macular edema, left eye: Secondary | ICD-10-CM | POA: Diagnosis not present

## 2016-01-02 DIAGNOSIS — H2512 Age-related nuclear cataract, left eye: Secondary | ICD-10-CM | POA: Diagnosis not present

## 2016-01-02 DIAGNOSIS — H35033 Hypertensive retinopathy, bilateral: Secondary | ICD-10-CM

## 2016-01-02 DIAGNOSIS — H43812 Vitreous degeneration, left eye: Secondary | ICD-10-CM

## 2016-07-12 ENCOUNTER — Ambulatory Visit (INDEPENDENT_AMBULATORY_CARE_PROVIDER_SITE_OTHER): Payer: BLUE CROSS/BLUE SHIELD | Admitting: Ophthalmology

## 2016-07-12 DIAGNOSIS — I10 Essential (primary) hypertension: Secondary | ICD-10-CM | POA: Diagnosis not present

## 2016-07-12 DIAGNOSIS — E11311 Type 2 diabetes mellitus with unspecified diabetic retinopathy with macular edema: Secondary | ICD-10-CM | POA: Diagnosis not present

## 2016-07-12 DIAGNOSIS — H2512 Age-related nuclear cataract, left eye: Secondary | ICD-10-CM | POA: Diagnosis not present

## 2016-07-12 DIAGNOSIS — H35033 Hypertensive retinopathy, bilateral: Secondary | ICD-10-CM | POA: Diagnosis not present

## 2016-07-12 DIAGNOSIS — E113312 Type 2 diabetes mellitus with moderate nonproliferative diabetic retinopathy with macular edema, left eye: Secondary | ICD-10-CM

## 2016-07-12 DIAGNOSIS — H43812 Vitreous degeneration, left eye: Secondary | ICD-10-CM | POA: Diagnosis not present

## 2016-07-12 DIAGNOSIS — E113591 Type 2 diabetes mellitus with proliferative diabetic retinopathy without macular edema, right eye: Secondary | ICD-10-CM | POA: Diagnosis not present

## 2016-11-15 ENCOUNTER — Ambulatory Visit (INDEPENDENT_AMBULATORY_CARE_PROVIDER_SITE_OTHER): Payer: BLUE CROSS/BLUE SHIELD | Admitting: Ophthalmology

## 2019-06-28 ENCOUNTER — Encounter (INDEPENDENT_AMBULATORY_CARE_PROVIDER_SITE_OTHER): Payer: BLUE CROSS/BLUE SHIELD | Admitting: Ophthalmology

## 2019-07-02 ENCOUNTER — Other Ambulatory Visit: Payer: Self-pay

## 2019-07-02 ENCOUNTER — Encounter (INDEPENDENT_AMBULATORY_CARE_PROVIDER_SITE_OTHER): Payer: Medicare Other | Admitting: Ophthalmology

## 2019-07-02 DIAGNOSIS — E113593 Type 2 diabetes mellitus with proliferative diabetic retinopathy without macular edema, bilateral: Secondary | ICD-10-CM | POA: Diagnosis not present

## 2019-07-02 DIAGNOSIS — E11319 Type 2 diabetes mellitus with unspecified diabetic retinopathy without macular edema: Secondary | ICD-10-CM

## 2019-07-02 DIAGNOSIS — H35033 Hypertensive retinopathy, bilateral: Secondary | ICD-10-CM

## 2019-07-02 DIAGNOSIS — H43813 Vitreous degeneration, bilateral: Secondary | ICD-10-CM

## 2019-07-02 DIAGNOSIS — I10 Essential (primary) hypertension: Secondary | ICD-10-CM

## 2019-12-31 ENCOUNTER — Encounter (INDEPENDENT_AMBULATORY_CARE_PROVIDER_SITE_OTHER): Payer: Medicare Other | Admitting: Ophthalmology

## 2020-01-10 ENCOUNTER — Encounter (INDEPENDENT_AMBULATORY_CARE_PROVIDER_SITE_OTHER): Payer: Medicare Other | Admitting: Ophthalmology

## 2020-01-10 DIAGNOSIS — I10 Essential (primary) hypertension: Secondary | ICD-10-CM | POA: Diagnosis not present

## 2020-01-10 DIAGNOSIS — E113392 Type 2 diabetes mellitus with moderate nonproliferative diabetic retinopathy without macular edema, left eye: Secondary | ICD-10-CM | POA: Diagnosis not present

## 2020-01-10 DIAGNOSIS — E11319 Type 2 diabetes mellitus with unspecified diabetic retinopathy without macular edema: Secondary | ICD-10-CM | POA: Diagnosis not present

## 2020-01-10 DIAGNOSIS — H43813 Vitreous degeneration, bilateral: Secondary | ICD-10-CM

## 2020-01-10 DIAGNOSIS — E113591 Type 2 diabetes mellitus with proliferative diabetic retinopathy without macular edema, right eye: Secondary | ICD-10-CM | POA: Diagnosis not present

## 2020-01-10 DIAGNOSIS — H35033 Hypertensive retinopathy, bilateral: Secondary | ICD-10-CM

## 2020-07-14 ENCOUNTER — Encounter (INDEPENDENT_AMBULATORY_CARE_PROVIDER_SITE_OTHER): Payer: Medicare Other | Admitting: Ophthalmology

## 2020-08-01 ENCOUNTER — Other Ambulatory Visit: Payer: Self-pay

## 2020-08-01 ENCOUNTER — Encounter (INDEPENDENT_AMBULATORY_CARE_PROVIDER_SITE_OTHER): Payer: Medicare Other | Admitting: Ophthalmology

## 2020-08-01 DIAGNOSIS — E113392 Type 2 diabetes mellitus with moderate nonproliferative diabetic retinopathy without macular edema, left eye: Secondary | ICD-10-CM

## 2020-08-01 DIAGNOSIS — I10 Essential (primary) hypertension: Secondary | ICD-10-CM

## 2020-08-01 DIAGNOSIS — H43812 Vitreous degeneration, left eye: Secondary | ICD-10-CM

## 2020-08-01 DIAGNOSIS — E113511 Type 2 diabetes mellitus with proliferative diabetic retinopathy with macular edema, right eye: Secondary | ICD-10-CM

## 2020-08-01 DIAGNOSIS — E11311 Type 2 diabetes mellitus with unspecified diabetic retinopathy with macular edema: Secondary | ICD-10-CM

## 2020-08-01 DIAGNOSIS — H35033 Hypertensive retinopathy, bilateral: Secondary | ICD-10-CM

## 2021-01-30 ENCOUNTER — Encounter (INDEPENDENT_AMBULATORY_CARE_PROVIDER_SITE_OTHER): Payer: BLUE CROSS/BLUE SHIELD | Admitting: Ophthalmology

## 2022-02-15 DEATH — deceased
